# Patient Record
Sex: Female | Born: 2000 | Hispanic: Yes | Smoking: Never smoker
Health system: Southern US, Community
[De-identification: ages and names within clinical notes are randomized; demographics above are authoritative.]

## PROBLEM LIST (undated history)

## (undated) HISTORY — PX: TONSILLECTOMY: SUR1361

---
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
## 2019-02-11 ENCOUNTER — Ambulatory Visit: Payer: Self-pay | Attending: Internal Medicine

## 2019-02-11 DIAGNOSIS — Z20822 Contact with and (suspected) exposure to covid-19: Secondary | ICD-10-CM

## 2019-02-12 LAB — NOVEL CORONAVIRUS, NAA: SARS-CoV-2, NAA: NOT DETECTED

## 2019-03-04 ENCOUNTER — Ambulatory Visit: Payer: Self-pay | Attending: Internal Medicine

## 2019-03-04 DIAGNOSIS — Z20822 Contact with and (suspected) exposure to covid-19: Secondary | ICD-10-CM | POA: Insufficient documentation

## 2019-03-05 LAB — NOVEL CORONAVIRUS, NAA: SARS-CoV-2, NAA: NOT DETECTED

## 2019-07-06 ENCOUNTER — Ambulatory Visit: Payer: Self-pay

## 2019-07-15 ENCOUNTER — Ambulatory Visit: Payer: Self-pay

## 2021-01-25 ENCOUNTER — Ambulatory Visit: Payer: BC Managed Care – PPO | Admitting: Family Medicine

## 2021-01-25 ENCOUNTER — Encounter: Payer: Self-pay | Admitting: Family Medicine

## 2021-01-25 ENCOUNTER — Other Ambulatory Visit: Payer: Self-pay

## 2021-01-25 VITALS — BP 123/70 | HR 88 | Temp 98.7°F | Resp 20 | Ht 61.0 in | Wt 137.5 lb

## 2021-01-25 DIAGNOSIS — F419 Anxiety disorder, unspecified: Secondary | ICD-10-CM | POA: Diagnosis not present

## 2021-01-25 DIAGNOSIS — F32A Depression, unspecified: Secondary | ICD-10-CM

## 2021-01-25 DIAGNOSIS — Z7689 Persons encountering health services in other specified circumstances: Secondary | ICD-10-CM | POA: Diagnosis not present

## 2021-01-25 MED ORDER — PAROXETINE HCL 20 MG PO TABS: 20.0000 mg | ORAL_TABLET | Freq: Every day | ORAL | 1 refills | Status: AC

## 2021-01-25 NOTE — Progress Notes (Signed)
New Patient Office Visit  Subjective:  Patient ID: Stephanie Owens, female    DOB: 2000-07-17  Age: 20 y.o. MRN: [id number]  CC:  Chief Complaint  Patient presents with   Establish Care    HPI Stephanie Owens presents for to establish care and for complaint of anxiety and depression.  worsening over past several weeks.   History reviewed. No pertinent past medical history.  Past Surgical History:  Procedure Laterality Date   TONSILLECTOMY      Family History  Problem Relation Age of Onset   Heart attack Father    Diabetes Mellitus II Paternal Aunt     Social History   Socioeconomic History   Marital status: Married    Spouse name: Not on file   Number of children: Not on file   Years of education: Not on file   Highest education level: Not on file  Occupational History   Not on file  Tobacco Use   Smoking status: Never   Smokeless tobacco: Never  Vaping Use   Vaping Use: Never used  Substance and Sexual Activity   Alcohol use: Not on file   Drug use: Yes    Types: Marijuana   Sexual activity: Yes    Birth control/protection: Implant  Other Topics Concern   Not on file  Social History Narrative   Not on file   Social Determinants of Health   Financial Resource Strain: Not on file  Food Insecurity: Not on file  Transportation Needs: Not on file  Physical Activity: Not on file  Stress: Not on file  Social Connections: Not on file  Intimate Partner Violence: Not on file    ROS Review of Systems  Psychiatric/Behavioral:  Positive for sleep disturbance. Negative for self-injury and suicidal ideas. The patient is nervous/anxious.   All other systems reviewed and are negative.  Objective:   Today's Vitals: BP 123/70   Pulse 88   Temp 98.7 F (37.1 C) (Temporal)   Resp 20   Ht 5\' 1"  (1.549 m)   Wt 137 lb 8 oz (62.4 kg)   LMP 01/01/2021   SpO2 96%   BMI 25.98 kg/m   Physical Exam Vitals and nursing note reviewed.  Constitutional:       General: She is not in acute distress. Cardiovascular:     Rate and Rhythm: Normal rate and regular rhythm.  Pulmonary:     Effort: Pulmonary effort is normal.     Breath sounds: Normal breath sounds.  Neurological:     General: No focal deficit present.     Mental Status: She is alert and oriented to person, place, and time.  Psychiatric:        Mood and Affect: Mood is anxious. Mood is not depressed.        Speech: Speech is rapid and pressured.        Behavior: Behavior normal.    Assessment & Plan:   1. Anxiety and depression Paxil 20 mg daily prescribed. monitor  2. Encounter to establish care     Outpatient Encounter Medications as of 01/25/2021  Medication Sig   levonorgestrel-ethinyl estradiol (NORDETTE) 0.15-30 MG-MCG tablet Take 1 tablet by mouth daily.   levonorgestrel-ethinyl estradiol (NORDETTE) 0.15-30 MG-MCG tablet    PARoxetine (PAXIL) 20 MG tablet Take 1 tablet (20 mg total) by mouth daily.   [DISCONTINUED] levonorgestrel-ethinyl estradiol (NORDETTE) 0.15-30 MG-MCG tablet Take 1 tablet by mouth daily.   [DISCONTINUED] etonogestrel (NEXPLANON) 68 MG IMPL implant 1 each by  Subdermal route once.   No facility-administered encounter medications on file as of 01/25/2021.    Follow-up: Return in about 2 weeks (around 02/08/2021) for follow up.   Tommie Raymond, MD

## 2021-01-26 ENCOUNTER — Encounter: Payer: Self-pay | Admitting: Family Medicine

## 2021-03-11 ENCOUNTER — Other Ambulatory Visit: Payer: Self-pay

## 2021-03-11 ENCOUNTER — Ambulatory Visit (INDEPENDENT_AMBULATORY_CARE_PROVIDER_SITE_OTHER): Payer: BC Managed Care – PPO | Admitting: Family Medicine

## 2021-03-11 ENCOUNTER — Encounter: Payer: Self-pay | Admitting: Family Medicine

## 2021-03-11 VITALS — BP 112/73 | HR 68 | Temp 98.3°F | Resp 16 | Ht 61.0 in | Wt 134.0 lb

## 2021-03-11 DIAGNOSIS — B360 Pityriasis versicolor: Secondary | ICD-10-CM

## 2021-03-11 DIAGNOSIS — Z01419 Encounter for gynecological examination (general) (routine) without abnormal findings: Secondary | ICD-10-CM

## 2021-03-11 DIAGNOSIS — Z130 Encounter for screening for diseases of the blood and blood-forming organs and certain disorders involving the immune mechanism: Secondary | ICD-10-CM

## 2021-03-11 DIAGNOSIS — F32A Depression, unspecified: Secondary | ICD-10-CM | POA: Diagnosis not present

## 2021-03-11 DIAGNOSIS — F419 Anxiety disorder, unspecified: Secondary | ICD-10-CM | POA: Diagnosis not present

## 2021-03-11 DIAGNOSIS — Z114 Encounter for screening for human immunodeficiency virus [HIV]: Secondary | ICD-10-CM

## 2021-03-11 DIAGNOSIS — Z13220 Encounter for screening for lipoid disorders: Secondary | ICD-10-CM

## 2021-03-11 DIAGNOSIS — Z1329 Encounter for screening for other suspected endocrine disorder: Secondary | ICD-10-CM

## 2021-03-11 MED ORDER — KETOCONAZOLE 1 % EX SHAM: MEDICATED_SHAMPOO | CUTANEOUS | 0 refills | Status: DC

## 2021-03-11 MED ORDER — FLUCONAZOLE 100 MG PO TABS: ORAL_TABLET | ORAL | 0 refills | Status: DC

## 2021-03-11 MED ORDER — PAROXETINE HCL 30 MG PO TABS: 30.0000 mg | ORAL_TABLET | Freq: Every day | ORAL | 0 refills | Status: DC

## 2021-03-11 NOTE — Progress Notes (Signed)
Patient is here for her CPE. Patient is concern about her skin discoloration.

## 2021-03-11 NOTE — Progress Notes (Signed)
Established Patient Office Visit  Subjective:  Patient ID: Stephanie Owens, female    DOB: June 20, 2000  Age: 21 y.o. MRN: [id number]  CC:  Chief Complaint  Patient presents with   Annual Exam    HPI Stephanie Owens presents for routine annual exam. Reports question about a rash. Also reports great improvement in mood and daily living with paxil but would like to try an increased dose.   No past medical history on file.  Past Surgical History:  Procedure Laterality Date   TONSILLECTOMY      Family History  Problem Relation Age of Onset   Heart attack Father    Diabetes Mellitus II Paternal Aunt     Social History   Socioeconomic History   Marital status: Married    Spouse name: Not on file   Number of children: Not on file   Years of education: Not on file   Highest education level: Not on file  Occupational History   Not on file  Tobacco Use   Smoking status: Never   Smokeless tobacco: Never  Vaping Use   Vaping Use: Never used  Substance and Sexual Activity   Alcohol use: Not on file   Drug use: Yes    Types: Marijuana   Sexual activity: Yes    Birth control/protection: Implant  Other Topics Concern   Not on file  Social History Narrative   Not on file   Social Determinants of Health   Financial Resource Strain: Not on file  Food Insecurity: Not on file  Transportation Needs: Not on file  Physical Activity: Not on file  Stress: Not on file  Social Connections: Not on file  Intimate Partner Violence: Not on file    ROS Review of Systems  Skin:  Positive for rash.  Psychiatric/Behavioral:  Negative for self-injury, sleep disturbance and suicidal ideas. The patient is nervous/anxious.   All other systems reviewed and are negative.  Objective:   Today's Vitals: BP 112/73    Pulse 68    Temp 98.3 F (36.8 C) (Oral)    Resp 16    Ht _0  (1.549 m)    Wt 134 lb (60.8 kg)    SpO2 97%    BMI 25.32 kg/m   Physical Exam Vitals and nursing note  reviewed.  Constitutional:      General: She is not in acute distress. HENT:     Head: Normocephalic and atraumatic.     Right Ear: Tympanic membrane, ear canal and external ear normal.     Left Ear: Tympanic membrane, ear canal and external ear normal.     Nose: Nose normal.     Mouth/Throat:     Mouth: Mucous membranes are moist.     Pharynx: Oropharynx is clear.  Eyes:     Conjunctiva/sclera: Conjunctivae normal.     Pupils: Pupils are equal, round, and reactive to light.  Neck:     Thyroid: No thyromegaly.  Cardiovascular:     Rate and Rhythm: Normal rate and regular rhythm.     Heart sounds: Normal heart sounds. No murmur heard. Pulmonary:     Effort: Pulmonary effort is normal. No respiratory distress.     Breath sounds: Normal breath sounds.  Abdominal:     General: There is no distension.     Palpations: Abdomen is soft. There is no mass.     Tenderness: There is no abdominal tenderness.  Musculoskeletal:        General: Normal range  of motion.     Cervical back: Normal range of motion and neck supple.  Skin:    General: Skin is warm and dry.     Findings: Rash (diffuse hypopigmentation on trunk c/w tinea versicolor) present.  Neurological:     General: No focal deficit present.     Mental Status: She is alert and oriented to person, place, and time.  Psychiatric:        Mood and Affect: Mood normal.        Behavior: Behavior normal.    Assessment & Plan:   1. Well woman exam Routine labs ordered.  - CMP14+EGFR - CBC with Differential  2. Tinea versicolor Diflucan and ketoconazole shampoo prescribed.   3. Screening for HIV (human immunodeficiency virus)  - HIV antibody (with reflex)  4. Encounter for lipid screening for cardiovascular disease  - Lipid Panel  5. Screening for endocrine/metabolic/immunity disorders  - TSH  6. Anxiety and depression Improved. Will increase paxil from 20 mg to 30 mg daily and monitor.    Outpatient Encounter  Medications as of 03/11/2021  Medication Sig   levonorgestrel-ethinyl estradiol (NORDETTE) 0.15-30 MG-MCG tablet Take 1 tablet by mouth daily.   levonorgestrel-ethinyl estradiol (NORDETTE) 0.15-30 MG-MCG tablet    PARoxetine (PAXIL) 20 MG tablet Take 1 tablet (20 mg total) by mouth daily.   No facility-administered encounter medications on file as of 03/11/2021.    Follow-up: No follow-ups on file.   Becky Sax, MD

## 2021-03-12 LAB — CMP14+EGFR
ALT: 19 IU/L (ref 0–32)
AST: 26 IU/L (ref 0–40)
Albumin/Globulin Ratio: 1.8 (ref 1.2–2.2)
Albumin: 4.8 g/dL (ref 3.9–5.0)
Alkaline Phosphatase: 58 IU/L (ref 44–121)
BUN/Creatinine Ratio: 15 (ref 9–23)
BUN: 12 mg/dL (ref 6–20)
Bilirubin Total: 0.3 mg/dL (ref 0.0–1.2)
CO2: 21 mmol/L (ref 20–29)
Calcium: 9.5 mg/dL (ref 8.7–10.2)
Chloride: 100 mmol/L (ref 96–106)
Creatinine, Ser: 0.82 mg/dL (ref 0.57–1.00)
Globulin, Total: 2.7 g/dL (ref 1.5–4.5)
Glucose: 82 mg/dL (ref 70–99)
Potassium: 4.2 mmol/L (ref 3.5–5.2)
Sodium: 137 mmol/L (ref 134–144)
Total Protein: 7.5 g/dL (ref 6.0–8.5)
eGFR: 104 mL/min/{1.73_m2} (ref >59)

## 2021-03-12 LAB — CBC WITH DIFFERENTIAL/PLATELET
Basophils Absolute: 0.1 10*3/uL (ref 0.0–0.2)
Basos: 1 %
EOS (ABSOLUTE): 0.1 10*3/uL (ref 0.0–0.4)
Eos: 1 %
Hematocrit: 42.5 % (ref 34.0–46.6)
Hemoglobin: 14.4 g/dL (ref 11.1–15.9)
Immature Grans (Abs): 0 10*3/uL (ref 0.0–0.1)
Immature Granulocytes: 0 %
Lymphocytes Absolute: 2.2 10*3/uL (ref 0.7–3.1)
Lymphs: 25 %
MCH: 32 pg (ref 26.6–33.0)
MCHC: 33.9 g/dL (ref 31.5–35.7)
MCV: 94 fL (ref 79–97)
Monocytes Absolute: 0.4 10*3/uL (ref 0.1–0.9)
Monocytes: 5 %
Neutrophils Absolute: 5.8 10*3/uL (ref 1.4–7.0)
Neutrophils: 68 %
Platelets: 406 10*3/uL (ref 150–450)
RBC: 4.5 x10E6/uL (ref 3.77–5.28)
RDW: 12.6 % (ref 11.7–15.4)
WBC: 8.5 10*3/uL (ref 3.4–10.8)

## 2021-03-12 LAB — LIPID PANEL
Chol/HDL Ratio: 2.3 ratio (ref 0.0–4.4)
Cholesterol, Total: 168 mg/dL (ref 100–199)
HDL: 72 mg/dL (ref >39)
LDL Chol Calc (NIH): 84 mg/dL (ref 0–99)
Triglycerides: 64 mg/dL (ref 0–149)
VLDL Cholesterol Cal: 12 mg/dL (ref 5–40)

## 2021-03-12 LAB — TSH: TSH: 2.22 u[IU]/mL (ref 0.450–4.500)

## 2021-03-12 LAB — HIV ANTIBODY (ROUTINE TESTING W REFLEX): HIV Screen 4th Generation wRfx: NONREACTIVE

## 2021-07-29 ENCOUNTER — Ambulatory Visit: Payer: Self-pay | Admitting: *Deleted

## 2021-07-29 NOTE — Telephone Encounter (Signed)
Reason for Disposition  [1] MODERATE pain (e.g., interferes with normal activities) AND [2] pain comes and goes (cramps) AND [3] present > 24 hours  (Exception: pain with Vomiting or Diarrhea - see that Guideline)  Answer Assessment - Initial Assessment Questions 1. LOCATION: "Where does it hurt?"      I'm having stomach pain.    2-3 weeks ago I vomited from desert and pizza.  This past Sunday night I had the same thing pizza then later I vomited again.   I ate hot chips again and they hurt my stomach.     Hurts towards center upper stomach.    I'm still nauseas. 2. RADIATION: "Does the pain shoot anywhere else?" (e.g., chest, back)     Left side sometimes it radiates but mostly just in my stomach.   I had diarrhea and really soft stool.    3. ONSET: "When did the pain begin?" (e.g., minutes, hours or days ago)      2-3 weeks. 4. SUDDEN: "Gradual or sudden onset?"     Suddenly 5. PATTERN "Does the pain come and go, or is it constant?"    - If constant: "Is it getting better, staying the same, or worsening?"      (Note: Constant means the pain never goes away completely; most serious pain is constant and it progresses)     - If intermittent: "How long does it last?" "Do you have pain now?"     (Note: Intermittent means the pain goes away completely between bouts)     *No Answer* 6. SEVERITY: "How bad is the pain?"  (e.g., Scale 1-10; mild, moderate, or severe)   - MILD (1-3): doesn't interfere with normal activities, abdomen soft and not tender to touch    - MODERATE (4-7): interferes with normal activities or awakens from sleep, abdomen tender to touch    - SEVERE (8-10): excruciating pain, doubled over, unable to do any normal activities       7. RECURRENT SYMPTOM: "Have you ever had this type of stomach pain before?" If Yes, ask: "When was the last time?" and "What happened that time?"      No 8. CAUSE: "What do you think is causing the stomach pain?"     I think the food I ate  triggered it. 9. RELIEVING/AGGRAVATING FACTORS: "What makes it better or worse?" (e.g., movement, antacids, bowel movement)     Avoiding those foods 10. OTHER SYMPTOMS: "Do you have any other symptoms?" (e.g., back pain, diarrhea, fever, urination pain, vomiting)       Diarrhea last time was this morning.   11. PREGNANCY: "Is there any chance you are pregnant?" "When was your last menstrual period?"       Pregnancy test was negative.  Protocols used: Abdominal Pain - Female-A-AH

## 2021-07-29 NOTE — Telephone Encounter (Signed)
  Chief Complaint: Upper middle stomach pain for last 2-3 weeks.   Had 2 episodes of vomiting.   Symptoms: After eating pizza and/or hot chips she has vomited  Mild diarrhea.   Frequency: 2 episodes where she vomited Pertinent Negatives: Patient denies blood in vomitus. Disposition: [] ED /[] Urgent Care (no appt availability in office) / [] Appointment(In office/virtual)/ []  [facility]/ [] Home Care/ [] Refused Recommended Disposition /[x] [facility]/ []  Follow-up with PCP Additional Notes: She is going to the [facility] on 08/03/2021 since there are no appts at [facility] at [facility].  I did schedule her with Dr. for 09/07/2021 at 3:20 for follow up.

## 2021-09-07 ENCOUNTER — Ambulatory Visit: Payer: BC Managed Care – PPO | Admitting: Family Medicine

## 2021-09-07 ENCOUNTER — Encounter: Payer: Self-pay | Admitting: Family Medicine

## 2021-09-07 VITALS — BP 113/83 | HR 70 | Temp 98.1°F | Resp 16 | Wt 136.4 lb

## 2021-09-07 DIAGNOSIS — Z3041 Encounter for surveillance of contraceptive pills: Secondary | ICD-10-CM

## 2021-09-07 DIAGNOSIS — R591 Generalized enlarged lymph nodes: Secondary | ICD-10-CM

## 2021-09-07 DIAGNOSIS — K219 Gastro-esophageal reflux disease without esophagitis: Secondary | ICD-10-CM | POA: Diagnosis not present

## 2021-09-07 MED ORDER — LEVONORGESTREL-ETHINYL ESTRAD 0.15-30 MG-MCG PO TABS: 1.0000 | ORAL_TABLET | Freq: Every day | ORAL | 1 refills | Status: DC

## 2021-09-07 MED ORDER — FAMOTIDINE 20 MG PO TABS: 20.0000 mg | ORAL_TABLET | Freq: Two times a day (BID) | ORAL | 4 refills | Status: AC

## 2021-09-07 MED ORDER — AMOXICILLIN 875 MG PO TABS: 875.0000 mg | ORAL_TABLET | Freq: Two times a day (BID) | ORAL | 0 refills | Status: AC

## 2021-09-07 NOTE — Progress Notes (Unsigned)
   New Patient Office Visit  Subjective    Patient ID: Stephanie Owens, female    DOB: Aug 31, 2000  Age: 21 y.o. MRN: [id number]  CC:  Chief Complaint  Patient presents with   Follow-up    N/V    HPI Stephanie Owens presents to establish care ***  Outpatient Encounter Medications as of 09/07/2021  Medication Sig   levonorgestrel-ethinyl estradiol (NORDETTE) 0.15-30 MG-MCG tablet Take 1 tablet by mouth daily.   PARoxetine (PAXIL) 20 MG tablet Take 1 tablet (20 mg total) by mouth daily.   [DISCONTINUED] fluconazole (DIFLUCAN) 100 MG tablet Take 300 mg once and repeat dose in one week.   [DISCONTINUED] KETOCONAZOLE, TOPICAL, 1 % SHAM Apply topically daily as recommended   [DISCONTINUED] levonorgestrel-ethinyl estradiol (NORDETTE) 0.15-30 MG-MCG tablet    [DISCONTINUED] PARoxetine (PAXIL) 30 MG tablet Take 1 tablet (30 mg total) by mouth daily.   No facility-administered encounter medications on file as of 09/07/2021.    History reviewed. No pertinent past medical history.  Past Surgical History:  Procedure Laterality Date   TONSILLECTOMY      Family History  Problem Relation Age of Onset   Heart attack Father    Diabetes Mellitus II Paternal Aunt     Social History   Socioeconomic History   Marital status: Married    Spouse name: Not on file   Number of children: Not on file   Years of education: Not on file   Highest education level: Not on file  Occupational History   Not on file  Tobacco Use   Smoking status: Never   Smokeless tobacco: Never  Vaping Use   Vaping Use: Never used  Substance and Sexual Activity   Alcohol use: Not on file   Drug use: Yes    Types: Marijuana   Sexual activity: Yes    Birth control/protection: Implant  Other Topics Concern   Not on file  Social History Narrative   Not on file   Social Determinants of Health   Financial Resource Strain: Not on file  Food Insecurity: Not on file  Transportation Needs: Not on file   Physical Activity: Not on file  Stress: Not on file  Social Connections: Not on file  Intimate Partner Violence: Not on file    ROS      Objective    BP 113/83   Pulse 70   Temp 98.1 F (36.7 C) (Oral)   Resp 16   Wt 136 lb 6.4 oz (61.9 kg)   SpO2 96%   BMI 25.77 kg/m   Physical Exam  {Labs (Optional):23779}    Assessment & Plan:   Problem List Items Addressed This Visit   None   No follow-ups on file.   Tommie Raymond, MD

## 2021-09-07 NOTE — Progress Notes (Unsigned)
Patient had n/v x 1 month ago. Patient has not had any for awhile but still  wanted to  get check out by provider.

## 2021-09-08 ENCOUNTER — Encounter: Payer: Self-pay | Admitting: Family Medicine

## 2022-03-14 ENCOUNTER — Encounter: Payer: Self-pay | Admitting: Family Medicine

## 2022-06-01 ENCOUNTER — Other Ambulatory Visit: Payer: Self-pay | Admitting: Family Medicine

## 2022-06-01 NOTE — Telephone Encounter (Signed)
Medication Refill - Medication: levonorgestrel-ethinyl estradiol (NORDETTE) 0.15-30 MG-MCG tablet   Has the patient contacted their pharmacy? Yes.   (Agent: If no, request that the patient contact the pharmacy for the refill. If patient does not wish to contact the pharmacy document the reason why and proceed with request.) (Agent: If yes, when and what did the pharmacy advise?)  Preferred Pharmacy (with phone number or street name):  [facility] [place], [place] - [address] [place] Phone: [phone]  Fax: [phone]     Has the patient been seen for an appointment in the last year OR does the patient have an upcoming appointment? Yes.    Agent: Please be advised that RX refills may take up to 3 business days. We ask that you follow-up with your pharmacy.

## 2022-06-02 MED ORDER — LEVONORGESTREL-ETHINYL ESTRAD 0.15-30 MG-MCG PO TABS: 1.0000 | ORAL_TABLET | Freq: Every day | ORAL | 1 refills | Status: AC

## 2022-06-02 NOTE — Telephone Encounter (Signed)
Patient will need to schedule an office visit for further refills. Requested Prescriptions  Pending Prescriptions Disp Refills   levonorgestrel-ethinyl estradiol (NORDETTE) 0.15-30 MG-MCG tablet 84 tablet 1    Sig: Take 1 tablet by mouth daily.     OB/GYN:  Contraceptives Passed - 06/01/2022  5:55 PM      Passed - Last BP in normal range    BP Readings from Last 1 Encounters:  09/07/21 113/83         Passed - Valid encounter within last 12 months    Recent Outpatient Visits           8 months ago Gastroesophageal reflux disease without esophagitis   [place] [facility], MD   1 year ago Well woman exam   [place] [facility], MD   1 year ago Anxiety and depression   [place] [facility], MD              Passed - Patient is not a smoker

## 2023-04-06 ENCOUNTER — Telehealth: Payer: Self-pay

## 2023-04-06 NOTE — Telephone Encounter (Signed)
Copied from CRM [id number]. Topic: Appointments - Transfer of Care >> Apr 06, 2023  1:26 PM Isabell A wrote: Pt is requesting to transfer FROM: [facility]  Pt is requesting to transfer TO: [facility]  Reason for requested transfer: Establish care for physical  It is the responsibility of the team the patient would like to transfer to Ria Clock, NP) to reach out to the patient if for any reason this transfer is not acceptable.

## 2023-04-20 ENCOUNTER — Encounter: Payer: Self-pay | Admitting: Family

## 2023-10-23 ENCOUNTER — Other Ambulatory Visit: Payer: Self-pay

## 2023-10-23 ENCOUNTER — Ambulatory Visit: Payer: Self-pay

## 2023-10-23 ENCOUNTER — Ambulatory Visit
Admission: EM | Admit: 2023-10-23 | Discharge: 2023-10-23 | Disposition: A | Discharge status: Home / Self Care | Payer: PRIVATE HEALTH INSURANCE | Attending: Family Medicine | Admitting: Family Medicine

## 2023-10-23 DIAGNOSIS — J029 Acute pharyngitis, unspecified: Secondary | ICD-10-CM

## 2023-10-23 DIAGNOSIS — B349 Viral infection, unspecified: Secondary | ICD-10-CM | POA: Diagnosis not present

## 2023-10-23 LAB — POC SOFIA SARS ANTIGEN FIA: SARS Coronavirus 2 Ag: NEGATIVE

## 2023-10-23 LAB — POCT RAPID STREP A (OFFICE): Rapid Strep A Screen: NEGATIVE

## 2023-10-23 NOTE — ED Triage Notes (Signed)
 Pt c/o sore throatx2d. Pt states last night her throat felt like it was starting to close up on her in the middle of the night waking her up.

## 2023-10-23 NOTE — Discharge Instructions (Signed)

## 2023-10-23 NOTE — ED Provider Notes (Signed)
 [facility]    CSN: [id number] Arrival date & time: 10/23/23  1458      History   Chief Complaint No chief complaint on file.   HPI Stephanie Owens is a 23 y.o. female  presents for evaluation of URI symptoms for 2 days. Patient reports associated symptoms of sore throat with some throat congestion. Denies N/V/D, cough, runny nose, fevers, ear pain, body aches, shortness of breath. Patient does not have a hx of asthma. Patient is an active smoker.   Reports no known sick contacts.  Pt has no other concerns at this time.   HPI  History reviewed. No pertinent past medical history.  There are no active problems to display for this patient.   Past Surgical History:  Procedure Laterality Date   TONSILLECTOMY      OB History     Gravida  0   Para  0   Term  0   Preterm  0   AB  0   Living  0      SAB  0   IAB  0   Ectopic  0   Multiple  0   Live Births  0            Home Medications    Prior to Admission medications   Medication Sig Start Date End Date Taking? Authorizing Provider  famotidine  (PEPCID ) 20 MG tablet Take 1 tablet (20 mg total) by mouth 2 (two) times daily. 09/07/21   Tanda Bleacher, MD  levonorgestrel -ethinyl estradiol (NORDETTE) 0.15-30 MG-MCG tablet Take 1 tablet by mouth daily. 06/02/22   Tanda Bleacher, MD  PARoxetine  (PAXIL ) 20 MG tablet Take 1 tablet (20 mg total) by mouth daily. 01/25/21   Tanda Bleacher, MD    Family History Family History  Problem Relation Age of Onset   Heart attack Father    Diabetes Mellitus II Paternal Aunt     Social History Social History   Tobacco Use   Smoking status: Never   Smokeless tobacco: Never  Vaping Use   Vaping status: Never Used  Substance Use Topics   Alcohol use: Yes    Alcohol/week: 8.0 standard drinks of alcohol    Types: 8 Shots of liquor per week   Drug use: Yes    Types: Marijuana     Allergies   Patient has no known allergies.   Review of  Systems Review of Systems  HENT:  Positive for sore throat.      Physical Exam Triage Vital Signs ED Triage Vitals  Encounter Vitals Group     BP 10/23/23 1513 110/73     Girls Systolic BP Percentile --      Girls Diastolic BP Percentile --      Boys Systolic BP Percentile --      Boys Diastolic BP Percentile --      Pulse Rate 10/23/23 1513 (!) 59     Resp 10/23/23 1513 16     Temp 10/23/23 1513 98.2 F (36.8 C)     Temp Source 10/23/23 1513 Oral     SpO2 10/23/23 1513 98 %     Weight --      Height --      Head Circumference --      Peak Flow --      Pain Score 10/23/23 1510 3     Pain Loc --      Pain Education --      Exclude from Growth Chart --  No data found.  Updated Vital Signs BP 110/73   Pulse (!) 59   Temp 98.2 F (36.8 C) (Oral)   Resp 16   LMP 08/29/2023   SpO2 98%   Visual Acuity Right Eye Distance:   Left Eye Distance:   Bilateral Distance:    Right Eye Near:   Left Eye Near:    Bilateral Near:     Physical Exam Vitals and nursing note reviewed.  Constitutional:      General: She is not in acute distress.    Appearance: She is well-developed. She is not ill-appearing.  HENT:     Head: Normocephalic and atraumatic.     Right Ear: Tympanic membrane and ear canal normal.     Left Ear: Tympanic membrane and ear canal normal.     Nose: No congestion or rhinorrhea.     Mouth/Throat:     Mouth: Mucous membranes are moist.     Pharynx: Oropharynx is clear. Uvula midline. Posterior oropharyngeal erythema present.     Tonsils: No tonsillar exudate or tonsillar abscesses.  Eyes:     Conjunctiva/sclera: Conjunctivae normal.     Pupils: Pupils are equal, round, and reactive to light.  Cardiovascular:     Rate and Rhythm: Regular rhythm. Bradycardia present.     Heart sounds: Normal heart sounds.     Comments: Heart rate 59 Pulmonary:     Effort: Pulmonary effort is normal.     Breath sounds: Normal breath sounds. No wheezing or rhonchi.   Musculoskeletal:     Cervical back: Normal range of motion and neck supple.  Lymphadenopathy:     Cervical: No cervical adenopathy.  Skin:    General: Skin is warm and dry.  Neurological:     General: No focal deficit present.     Mental Status: She is alert and oriented to person, place, and time.  Psychiatric:        Mood and Affect: Mood normal.        Behavior: Behavior normal.      UC Treatments / Results  Labs (all labs ordered are listed, but only abnormal results are displayed) Labs Reviewed  POC SOFIA SARS ANTIGEN FIA  POCT RAPID STREP A (OFFICE)    EKG   Radiology No results found.  Procedures Procedures (including critical care time)  Medications Ordered in UC Medications - No data to display  Initial Impression / Assessment and Plan / UC Course  I have reviewed the triage vital signs and the nursing notes.  Pertinent labs & imaging results that were available during my care of the patient were reviewed by me and considered in my medical decision making (see chart for details).     Reviewed exam and symptoms with patient.  No red flags.  Negative rapid strep and COVID testing.  Discussed viral illness and symptomatic treatment.  PCP follow-up if symptoms do not improve.  ER precautions reviewed. Final Clinical Impressions(s) / UC Diagnoses   Final diagnoses:  Sore throat  Viral illness     Discharge Instructions       Please treat your symptoms with over the counter cough medication, tylenol or ibuprofen , humidifier, and rest. Viral illnesses can last 7-14 days. Please follow up with your PCP if your symptoms are not improving. Please go to the ER for any worsening symptoms. This includes but is not limited to fever you can not control with tylenol or ibuprofen , you are not able to stay hydrated, you have shortness  of breath or chest pain.  Thank you for choosing [facility] for your healthcare needs. I hope you feel better soon!      ED  Prescriptions   None    PDMP not reviewed this encounter.   Loreda Myla SAUNDERS, NP 10/23/23 [phone]

## 2023-10-30 ENCOUNTER — Ambulatory Visit
Admission: EM | Admit: 2023-10-30 | Discharge: 2023-10-30 | Disposition: A | Discharge status: Home / Self Care | Payer: PRIVATE HEALTH INSURANCE | Attending: Family Medicine | Admitting: Family Medicine

## 2023-10-30 DIAGNOSIS — H65191 Other acute nonsuppurative otitis media, right ear: Secondary | ICD-10-CM

## 2023-10-30 MED ORDER — AMOXICILLIN 875 MG PO TABS: 875.0000 mg | ORAL_TABLET | Freq: Two times a day (BID) | ORAL | 0 refills | Status: AC

## 2023-10-30 MED ORDER — PSEUDOEPHEDRINE HCL 30 MG PO TABS: 30.0000 mg | ORAL_TABLET | Freq: Three times a day (TID) | ORAL | 0 refills | Status: AC | PRN

## 2023-10-30 MED ORDER — CETIRIZINE HCL 10 MG PO TABS: 10.0000 mg | ORAL_TABLET | Freq: Every day | ORAL | 0 refills | Status: AC

## 2023-10-30 NOTE — ED Triage Notes (Signed)
 Pt reports right ear pain, hearing decrease, ringing x 2 days; swelling around the piercing's in the right ear; itchy eyes x 3-4 days, were crusted at the beginning.

## 2023-10-30 NOTE — ED Provider Notes (Signed)
 [facility]  Note:  This document was prepared using Conservation officer, historic buildings and may include unintentional dictation errors.  MRN: [id number] DOB: 2000-04-30  Subjective:   Stephanie Owens is a 23 y.o. female presenting for 2-day history of right ear pain, decreased hearing and fullness, ringing.  Has developed some swelling around her piercings.  Last week, patient was seen and diagnosed with a viral respiratory illness.  She also had eye irritation, crusting of the eyelashes that has since resolved.  Her respiratory symptoms have improved except for the new developing ear pain.  No current facility-administered medications for this encounter.  Current Outpatient Medications:    famotidine  (PEPCID ) 20 MG tablet, Take 1 tablet (20 mg total) by mouth 2 (two) times daily., Disp: 60 tablet, Rfl: 4   levonorgestrel -ethinyl estradiol (NORDETTE) 0.15-30 MG-MCG tablet, Take 1 tablet by mouth daily., Disp: 84 tablet, Rfl: 1   PARoxetine  (PAXIL ) 20 MG tablet, Take 1 tablet (20 mg total) by mouth daily., Disp: 30 tablet, Rfl: 1   No Known Allergies  History reviewed. No pertinent past medical history.   Past Surgical History:  Procedure Laterality Date   TONSILLECTOMY      Family History  Problem Relation Age of Onset   Heart attack Father    Diabetes Mellitus II Paternal Aunt     Social History   Tobacco Use   Smoking status: Never   Smokeless tobacco: Never  Vaping Use   Vaping status: Never Used  Substance Use Topics   Alcohol use: Yes    Alcohol/week: 8.0 standard drinks of alcohol    Types: 8 Shots of liquor per week   Drug use: Yes    Types: Marijuana    ROS   Objective:   Vitals: BP 101/63 (BP Location: Left Arm)   Pulse 65   Temp 98.7 F (37.1 C) (Oral)   Resp 14   LMP  (Within Months) Comment: July 2025  SpO2 97%   Physical Exam Constitutional:      General: She is not in acute distress.    Appearance: Normal  appearance. She is well-developed and normal weight. She is not ill-appearing, toxic-appearing or diaphoretic.  HENT:     Head: Normocephalic and atraumatic.     Right Ear: Ear canal and external ear normal. No drainage or tenderness. No middle ear effusion. There is no impacted cerumen. Tympanic membrane is erythematous and bulging.     Left Ear: Tympanic membrane, ear canal and external ear normal. No drainage or tenderness.  No middle ear effusion. There is no impacted cerumen. Tympanic membrane is not erythematous or bulging.     Nose: Nose normal. No congestion or rhinorrhea.     Mouth/Throat:     Mouth: Mucous membranes are moist. No oral lesions.     Pharynx: No pharyngeal swelling, oropharyngeal exudate, posterior oropharyngeal erythema or uvula swelling.     Tonsils: No tonsillar exudate or tonsillar abscesses.  Eyes:     General: Lids are normal. Lids are everted, no foreign bodies appreciated. Vision grossly intact. No scleral icterus.       Right eye: No foreign body, discharge or hordeolum.        Left eye: No foreign body, discharge or hordeolum.     Extraocular Movements: Extraocular movements intact.     Right eye: Normal extraocular motion.     Left eye: Normal extraocular motion and no nystagmus.     Conjunctiva/sclera: Conjunctivae normal.  Right eye: Right conjunctiva is not injected. No chemosis, exudate or hemorrhage.    Left eye: Left conjunctiva is not injected. No chemosis, exudate or hemorrhage. Cardiovascular:     Rate and Rhythm: Normal rate.  Pulmonary:     Effort: Pulmonary effort is normal.  Musculoskeletal:     Cervical back: Normal range of motion and neck supple.  Lymphadenopathy:     Cervical: No cervical adenopathy.  Skin:    General: Skin is warm and dry.  Neurological:     General: No focal deficit present.     Mental Status: She is alert and oriented to person, place, and time.  Psychiatric:        Mood and Affect: Mood normal.         Behavior: Behavior normal.     Assessment and Plan :   PDMP not reviewed this encounter.  1. Other non-recurrent acute nonsuppurative otitis media of right ear    Start amoxicillin  to cover for otitis media likely secondary to her viral URI. Use supportive care otherwise. Counseled patient on potential for adverse effects with medications prescribed/recommended today, ER and return-to-clinic precautions discussed, patient verbalized understanding.    Christopher Savannah, [place] 10/30/23 1929

## 2023-10-31 ENCOUNTER — Ambulatory Visit: Payer: Self-pay
